# Patient Record
Sex: Female | Born: 1960 | Race: Black or African American | Hispanic: No | Smoking: Never smoker
Health system: Southern US, Community
[De-identification: ages and names within clinical notes are randomized; demographics above are authoritative.]

## PROBLEM LIST (undated history)

## (undated) DIAGNOSIS — D649 Anemia, unspecified: Secondary | ICD-10-CM

## (undated) DIAGNOSIS — K219 Gastro-esophageal reflux disease without esophagitis: Secondary | ICD-10-CM

## (undated) DIAGNOSIS — E119 Type 2 diabetes mellitus without complications: Secondary | ICD-10-CM

## (undated) DIAGNOSIS — I10 Essential (primary) hypertension: Secondary | ICD-10-CM

## (undated) DIAGNOSIS — M1990 Unspecified osteoarthritis, unspecified site: Secondary | ICD-10-CM

## (undated) HISTORY — PX: ABDOMINAL HYSTERECTOMY: SHX81

---

## 2013-05-16 DIAGNOSIS — E119 Type 2 diabetes mellitus without complications: Secondary | ICD-10-CM | POA: Insufficient documentation

## 2013-05-16 DIAGNOSIS — E782 Mixed hyperlipidemia: Secondary | ICD-10-CM | POA: Insufficient documentation

## 2013-05-16 DIAGNOSIS — K219 Gastro-esophageal reflux disease without esophagitis: Secondary | ICD-10-CM | POA: Insufficient documentation

## 2013-05-16 DIAGNOSIS — I509 Heart failure, unspecified: Secondary | ICD-10-CM | POA: Insufficient documentation

## 2013-08-03 DIAGNOSIS — G479 Sleep disorder, unspecified: Secondary | ICD-10-CM | POA: Insufficient documentation

## 2013-12-30 DIAGNOSIS — M722 Plantar fascial fibromatosis: Secondary | ICD-10-CM | POA: Insufficient documentation

## 2015-03-11 ENCOUNTER — Emergency Department
Admission: EM | Admit: 2015-03-11 | Discharge: 2015-03-11 | Discharge status: Against Medical Advice | Payer: BC Managed Care – PPO | Attending: Emergency Medicine | Admitting: Emergency Medicine

## 2015-03-11 ENCOUNTER — Encounter: Payer: Self-pay | Admitting: *Deleted

## 2015-03-11 ENCOUNTER — Other Ambulatory Visit: Payer: Self-pay

## 2015-03-11 DIAGNOSIS — E1165 Type 2 diabetes mellitus with hyperglycemia: Secondary | ICD-10-CM | POA: Insufficient documentation

## 2015-03-11 DIAGNOSIS — I10 Essential (primary) hypertension: Secondary | ICD-10-CM | POA: Insufficient documentation

## 2015-03-11 HISTORY — DX: Type 2 diabetes mellitus without complications: E11.9

## 2015-03-11 HISTORY — DX: Gastro-esophageal reflux disease without esophagitis: K21.9

## 2015-03-11 HISTORY — DX: Essential (primary) hypertension: I10

## 2015-03-11 HISTORY — DX: Anemia, unspecified: D64.9

## 2015-03-11 HISTORY — DX: Unspecified osteoarthritis, unspecified site: M19.90

## 2015-03-11 NOTE — ED Notes (Signed)
Pt states she was at home and called by her health insurance company for a survey. After answering the questions pt was told to come to the ed for eval. Pt is not complaining of any issues at this time. Pt has not been seen by her pcp. Recently. Pt states she has been out of her meds for a week or so.

## 2015-03-12 MED ORDER — DEXTROSE 5 % IV SOLN
INTRAVENOUS | Status: AC
  Filled 2015-03-12: qty 10

## 2016-08-03 DIAGNOSIS — J301 Allergic rhinitis due to pollen: Secondary | ICD-10-CM | POA: Insufficient documentation

## 2016-08-20 ENCOUNTER — Ambulatory Visit: Payer: BC Managed Care – PPO | Admitting: Podiatry

## 2016-09-07 ENCOUNTER — Ambulatory Visit: Payer: BC Managed Care – PPO | Admitting: Podiatry

## 2016-09-27 ENCOUNTER — Other Ambulatory Visit: Payer: Self-pay | Admitting: *Deleted

## 2016-09-27 ENCOUNTER — Encounter: Payer: Self-pay | Admitting: Podiatry

## 2016-09-27 ENCOUNTER — Ambulatory Visit (INDEPENDENT_AMBULATORY_CARE_PROVIDER_SITE_OTHER): Payer: BC Managed Care – PPO | Admitting: Podiatry

## 2016-09-27 ENCOUNTER — Ambulatory Visit (INDEPENDENT_AMBULATORY_CARE_PROVIDER_SITE_OTHER): Payer: BC Managed Care – PPO

## 2016-09-27 VITALS — BP 161/86 | HR 68 | Resp 16

## 2016-09-27 DIAGNOSIS — M722 Plantar fascial fibromatosis: Secondary | ICD-10-CM

## 2016-09-27 DIAGNOSIS — E1142 Type 2 diabetes mellitus with diabetic polyneuropathy: Secondary | ICD-10-CM

## 2016-09-27 DIAGNOSIS — E119 Type 2 diabetes mellitus without complications: Secondary | ICD-10-CM

## 2016-09-27 MED ORDER — MELOXICAM 15 MG PO TABS: 15.0000 mg | ORAL_TABLET | Freq: Every day | ORAL | 3 refills | Status: AC

## 2016-09-27 NOTE — Progress Notes (Signed)
   Subjective:    Patient ID: Audrey Reynolds, female    DOB: 03-02-1961, 55 y.o.   MRN: [id number]  HPI: She presents today chief complaint of plantar ill pain to the right. She states that is an aching for the past several months morning seems to be worse noted she is also developed some tingling to her toes which is sporadic and not all the time. She has tried ibuprofen for the heel with some degree of resolution. She recalls that her last hemoglobin A1c was around a 10.0. She states that she needs to have abdominal surgery for fibroids but being that it is an elective surgery she cannot have it.    Review of Systems  All other systems reviewed and are negative.      Objective:   Physical Exam: Vital signs are stable alert and oriented 3. Pulses are palpable. Neurologic sensorium is intact. Deep tendon reflexes are intact. Muscle strength is 5 over 5 wrist flexor plantar flexors and inverters inverters all intrinsic musculature is intact. Orthopedic evaluationpain on palpation medial cocaine tubercle of the right heel radiographs demonstrate flat feet with no major osseous abnormalities. No fractures are identified. 3 views bilateral foot were taken today in the office. She does have a soft tissue increase in density of the plantar fascia calcaneal insertion site of the right heel. Plantar distally oriented calcaneal heel spurs also noted.     Assessment & Plan:  Diabetes mellitus type 2 probable early diabetic neuropathy. Plantar fascitis right heel. Pes planus.  Plan: I injected the right heel today with Kenalog and local anesthetic. Placed her on meloxicam dispense plantar fascia brace and a night splint. Discussed appropriate shoe gear stretching exercises and ice therapy. Follow up with her in 1 month. Dispensed oral and written exercises physical therapy program.

## 2016-09-27 NOTE — Patient Instructions (Signed)

## 2016-10-27 ENCOUNTER — Encounter: Payer: Self-pay | Admitting: Podiatry

## 2016-10-27 ENCOUNTER — Ambulatory Visit (INDEPENDENT_AMBULATORY_CARE_PROVIDER_SITE_OTHER): Payer: BC Managed Care – PPO | Admitting: Podiatry

## 2016-10-27 DIAGNOSIS — M722 Plantar fascial fibromatosis: Secondary | ICD-10-CM

## 2016-10-28 NOTE — Progress Notes (Signed)
She presents today for follow-up of her plantar fasciitis in her right foot. She states that is approximately 100% improved states that it does not hurt hardly ever at this point and she continues her conservative therapies medications: Fish braces at night splint. She also purchased new shoes.  Objective: Vital signs are stable alert and oriented 3. Pulses are palpable. She has no pain on palpation medial calcaneal tubercle of the right heel.  Assessment: Resolved plantar fasciitis right.  Plan: Follow up with us on an as-needed basis.

## 2017-01-18 DIAGNOSIS — E669 Obesity, unspecified: Secondary | ICD-10-CM | POA: Insufficient documentation

## 2017-05-05 DIAGNOSIS — H8113 Benign paroxysmal vertigo, bilateral: Secondary | ICD-10-CM | POA: Insufficient documentation

## 2017-11-22 DIAGNOSIS — Z791 Long term (current) use of non-steroidal anti-inflammatories (NSAID): Secondary | ICD-10-CM | POA: Insufficient documentation

## 2017-11-22 DIAGNOSIS — G4700 Insomnia, unspecified: Secondary | ICD-10-CM | POA: Insufficient documentation

## 2017-11-22 DIAGNOSIS — E559 Vitamin D deficiency, unspecified: Secondary | ICD-10-CM | POA: Insufficient documentation

## 2017-11-22 DIAGNOSIS — R768 Other specified abnormal immunological findings in serum: Secondary | ICD-10-CM | POA: Insufficient documentation

## 2017-11-22 DIAGNOSIS — G8929 Other chronic pain: Secondary | ICD-10-CM | POA: Insufficient documentation

## 2017-11-22 DIAGNOSIS — R5383 Other fatigue: Secondary | ICD-10-CM | POA: Insufficient documentation

## 2017-11-22 DIAGNOSIS — R7689 Other specified abnormal immunological findings in serum: Secondary | ICD-10-CM | POA: Insufficient documentation

## 2017-11-22 DIAGNOSIS — G5601 Carpal tunnel syndrome, right upper limb: Secondary | ICD-10-CM | POA: Insufficient documentation

## 2017-11-22 DIAGNOSIS — M159 Polyosteoarthritis, unspecified: Secondary | ICD-10-CM | POA: Insufficient documentation

## 2017-11-22 DIAGNOSIS — L659 Nonscarring hair loss, unspecified: Secondary | ICD-10-CM | POA: Insufficient documentation

## 2018-01-12 DIAGNOSIS — IMO0001 Reserved for inherently not codable concepts without codable children: Secondary | ICD-10-CM | POA: Insufficient documentation

## 2018-04-13 DIAGNOSIS — Z79899 Other long term (current) drug therapy: Secondary | ICD-10-CM | POA: Insufficient documentation

## 2018-04-21 ENCOUNTER — Emergency Department: Payer: BC Managed Care – PPO

## 2018-04-21 ENCOUNTER — Emergency Department
Admission: EM | Admit: 2018-04-21 | Discharge: 2018-04-21 | Disposition: A | Discharge status: Home / Self Care | Payer: BC Managed Care – PPO | Attending: Emergency Medicine | Admitting: Emergency Medicine

## 2018-04-21 ENCOUNTER — Encounter: Payer: Self-pay | Admitting: Emergency Medicine

## 2018-04-21 ENCOUNTER — Other Ambulatory Visit: Payer: Self-pay

## 2018-04-21 DIAGNOSIS — Z7984 Long term (current) use of oral hypoglycemic drugs: Secondary | ICD-10-CM | POA: Insufficient documentation

## 2018-04-21 DIAGNOSIS — E119 Type 2 diabetes mellitus without complications: Secondary | ICD-10-CM | POA: Insufficient documentation

## 2018-04-21 DIAGNOSIS — I10 Essential (primary) hypertension: Secondary | ICD-10-CM | POA: Insufficient documentation

## 2018-04-21 DIAGNOSIS — R079 Chest pain, unspecified: Secondary | ICD-10-CM | POA: Diagnosis not present

## 2018-04-21 DIAGNOSIS — M549 Dorsalgia, unspecified: Secondary | ICD-10-CM | POA: Diagnosis not present

## 2018-04-21 LAB — BASIC METABOLIC PANEL
Anion gap: 11 (ref 5–15)
BUN: 9 mg/dL (ref 6–20)
CALCIUM: 9.3 mg/dL (ref 8.9–10.3)
CO2: 29 mmol/L (ref 22–32)
CREATININE: 0.61 mg/dL (ref 0.44–1.00)
Chloride: 93 mmol/L — ABNORMAL LOW (ref 101–111)
GFR calc Af Amer: >60 mL/min (ref >60)
Glucose, Bld: 307 mg/dL — ABNORMAL HIGH (ref 65–99)
POTASSIUM: 3.8 mmol/L (ref 3.5–5.1)
SODIUM: 133 mmol/L — AB (ref 135–145)

## 2018-04-21 LAB — CBC
HEMATOCRIT: 30.2 % — AB (ref 35.0–47.0)
Hemoglobin: 10.5 g/dL — ABNORMAL LOW (ref 12.0–16.0)
MCH: 29.4 pg (ref 26.0–34.0)
MCHC: 34.6 g/dL (ref 32.0–36.0)
MCV: 85.1 fL (ref 80.0–100.0)
PLATELETS: 283 10*3/uL (ref 150–440)
RBC: 3.55 MIL/uL — ABNORMAL LOW (ref 3.80–5.20)
RDW: 13.6 % (ref 11.5–14.5)
WBC: 9.1 10*3/uL (ref 3.6–11.0)

## 2018-04-21 LAB — TROPONIN I: Troponin I: <0.03 ng/mL (ref <0.03)

## 2018-04-21 MED ORDER — SODIUM CHLORIDE 0.9 % IV BOLUS
1000.0000 mL | Freq: Once | INTRAVENOUS | Status: AC
  Administered 2018-04-21: 1000 mL via INTRAVENOUS

## 2018-04-21 MED ORDER — IOPAMIDOL (ISOVUE-370) INJECTION 76%
75.0000 mL | Freq: Once | INTRAVENOUS | Status: AC | PRN
  Administered 2018-04-21: 75 mL via INTRAVENOUS

## 2018-04-21 NOTE — ED Notes (Signed)
Reviewed discharge instructions, follow-up care with patient. Patient verbalized understanding of all information reviewed. Patient stable, with no distress noted at this time.    

## 2018-04-21 NOTE — ED Provider Notes (Signed)
[facility] Emergency Department Provider Note ____________________________________________   First MD Initiated Contact with Patient 04/21/18 1706     (approximate)  I have reviewed the triage vital signs and the nursing notes.   HISTORY  Chief Complaint Chest Pain and Back Pain  HPI Audrey Reynolds is a 57 y.o. female with history of diabetes, hypertension and a lap scopic hysterectomy 4 days ago who is presenting to the emergency department 24 hours of chest pressure as well as right-sided back pressure which is constant.  Not associated with any cough or shortness of breath.  Slight worsening with deep breathing and movement.  Sent in by her primary care doctor for evaluation for pulmonary embolus.  Family history of CAD but no personal history of CAD.  Personal history of anemia.  Denies any vaginal bleeding or discharge.  Still with lower abdominal pain from the surgery which is now mild.  Says that the pain in her chest as well as the back is relieved by Tylenol.  Says that she has been nauseous as well since the surgery.  Past Medical History:  Diagnosis Date  . Diabetes mellitus without complication (HCC)   . Hypertension     There are no active problems to display for this patient.   Past Surgical History:  Procedure Laterality Date  . ABDOMINAL HYSTERECTOMY      Prior to Admission medications   Medication Sig Start Date End Date Taking? Authorizing Provider  acetaminophen-codeine (TYLENOL #3) 300-30 MG tablet  06/25/16   [provider]  amLODipine (NORVASC) 10 MG tablet  08/24/16   [provider]  amoxicillin-clavulanate (AUGMENTIN) 875-125 MG tablet  07/13/16   [provider]  atorvastatin (LIPITOR) 10 MG tablet  08/24/16   [provider]  cephALEXin (KEFLEX) 500 MG capsule  06/22/16   [provider]  doxycycline (VIBRAMYCIN) 100 MG capsule  06/22/16   [provider]  glipiZIDE  (GLUCOTROL) 10 MG tablet  08/03/16   [provider]  ibuprofen (ADVIL,MOTRIN) 600 MG tablet  08/03/16   [provider]  losartan-hydrochlorothiazide (HYZAAR) 100-25 MG tablet  08/03/16   [provider]  meloxicam (MOBIC) 15 MG tablet Take 1 tablet (15 mg total) by mouth daily. 09/27/16   Hyatt, Max T, DPM  mometasone (NASONEX) 50 MCG/ACT nasal spray  08/03/16   [provider]  ondansetron (ZOFRAN) 4 MG tablet  07/13/16   [provider]  pantoprazole (PROTONIX) 40 MG tablet  07/12/16   [provider]  polyethylene glycol powder (GLYCOLAX/MIRALAX) powder  06/25/16   [provider]  SSD 1 % cream  06/25/16   [provider]    Allergies Patient has no known allergies.  No family history on file.  Social History Social History   Tobacco Use  . Smoking status: Never Smoker  . Smokeless tobacco: Never Used  Substance Use Topics  . Alcohol use: No  . Drug use: Not on file    Review of Systems  Constitutional: No fever/chills Eyes: No visual changes. ENT: No sore throat. Cardiovascular: As above Respiratory: Denies shortness of breath. Gastrointestinal: No abdominal pain.  No nausea, no vomiting.  No diarrhea.  No constipation. Genitourinary: Negative for dysuria. Musculoskeletal: As above Skin: Negative for rash. Neurological: Negative for headaches, focal weakness or numbness.   ____________________________________________   PHYSICAL EXAM:  VITAL SIGNS: ED Triage Vitals  Enc Vitals Group     BP 04/21/18 1532 (!) 141/66  Pulse Rate 04/21/18 1532 92     Resp 04/21/18 1532 16     Temp 04/21/18 1532 98.9 F (37.2 C)     Temp Source 04/21/18 1532 Oral     SpO2 04/21/18 1532 99 %     Weight 04/21/18 1530 196 lb (88.9 kg)     Height 04/21/18 1530 5\' 5"  (1.651 m)     Head Circumference --      Peak Flow --      Pain Score 04/21/18 1530 8     Pain Loc --      Pain Edu? --      Excl. in GC? --      Constitutional: Alert and oriented. Well appearing and in no acute distress. Eyes: Conjunctivae are normal.  Head: Atraumatic. Nose: No congestion/rhinnorhea. Mouth/Throat: Mucous membranes are moist.  Neck: No stridor.   Cardiovascular: Normal rate, regular rhythm. Grossly normal heart sounds.  Chest pain is mildly reproducible underneath the right breast without any crepitus or deformity. Respiratory: Normal respiratory effort.  No retractions. Lungs CTAB. Gastrointestinal: Soft with mild tenderness to palpation across lower abdomen.  No rebound or guarding.  Surgical incisions are CDI. No distention. No CVA tenderness. Musculoskeletal: No lower extremity tenderness nor edema.  No joint effusions.  No tenderness to the thoracic nor lumbar spine regions. Neurologic:  Normal speech and language. No gross focal neurologic deficits are appreciated. Skin:  Skin is warm, dry and intact. No rash noted. Psychiatric: Mood and affect are normal. Speech and behavior are normal.  ____________________________________________   LABS (all labs ordered are listed, but only abnormal results are displayed)  Labs Reviewed  BASIC METABOLIC PANEL - Abnormal; Notable for the following components:      Result Value   Sodium 133 (*)    Chloride 93 (*)    Glucose, Bld 307 (*)    All other components within normal limits  CBC - Abnormal; Notable for the following components:   RBC 3.55 (*)    Hemoglobin 10.5 (*)    HCT 30.2 (*)    All other components within normal limits  TROPONIN I  TROPONIN I  POC URINE PREG, ED   ____________________________________________  EKG  ED ECG REPORT I, Arelia Longest, the attending physician, personally viewed and interpreted this ECG.   Date: 04/21/2018  EKG Time: 1537  Rate: 93  Rhythm: normal sinus rhythm  Axis: Normal  Intervals:none  ST&T Change: No ST segment elevation or depression.  T wave inversions in 1 as well as aVL.  No previous EKG for  comparison  ____________________________________________  RADIOLOGY  Chest x-ray without any acute abnormality  CT angiography was negative for PE as well as other acute process ____________________________________________   PROCEDURES  Procedure(s) performed:   Procedures  Critical Care performed:   ____________________________________________   INITIAL IMPRESSION / ASSESSMENT AND PLAN / ED COURSE  Pertinent labs & imaging results that were available during my care of the patient were reviewed by me and considered in my medical decision making (see chart for details).  Differential diagnosis includes, but is not limited to, ACS, aortic dissection, pulmonary embolism, cardiac tamponade, pneumothorax, pneumonia, pericarditis, myocarditis, GI-related causes including esophagitis/gastritis, and musculoskeletal chest wall pain.   No previous records on file for review  ----------------------------------------- 7:58 PM on 04/21/2018 -----------------------------------------  Patient remains without distress.  Not tachycardic.  Second troponin negative.  Reassuring CTA.  Possible chest wall pain.  Patient will follow-up with cardiology.  She knows that  she was follow-up in the next 48 to 72 hours.  Patient understanding the diagnosis as well as treatment plan willing to comply.  ____________________________________________   FINAL CLINICAL IMPRESSION(S) / ED DIAGNOSES  Chest pain    NEW MEDICATIONS STARTED DURING THIS VISIT:  New Prescriptions   No medications on file     Note:  This document was prepared using Dragon voice recognition software and may include unintentional dictation errors.     Myrna Blazer, MD 04/21/18 [phone]

## 2018-04-21 NOTE — ED Triage Notes (Signed)
Pt to ED via POV c/o right sided chest pain and and back pain. Pt states that she was advised by her MD to come to ED and be evaluated for PE. Pt had surgery on Friday. Pt states that she has been having shortness of breath as well. Pt is in NAD at this time, able to speak in complete sentences.

## 2018-04-24 ENCOUNTER — Encounter: Payer: Self-pay | Admitting: *Deleted

## 2018-04-24 ENCOUNTER — Telehealth: Payer: Self-pay

## 2018-04-24 NOTE — Telephone Encounter (Signed)
Lmov for patient to call and schedule ED fu She was seen on 04/21/18 for CP   Will try again at a later time

## 2018-04-27 NOTE — Telephone Encounter (Signed)
Pt scheduled 06/21/18 with Dr Okey DupreEnd

## 2018-06-21 ENCOUNTER — Encounter: Payer: Self-pay | Admitting: Internal Medicine

## 2018-06-21 ENCOUNTER — Ambulatory Visit (INDEPENDENT_AMBULATORY_CARE_PROVIDER_SITE_OTHER): Payer: BC Managed Care – PPO | Admitting: Internal Medicine

## 2018-06-21 VITALS — BP 164/84 | HR 83 | Ht 64.0 in | Wt 200.0 lb

## 2018-06-21 DIAGNOSIS — I10 Essential (primary) hypertension: Secondary | ICD-10-CM

## 2018-06-21 DIAGNOSIS — I272 Pulmonary hypertension, unspecified: Secondary | ICD-10-CM | POA: Diagnosis not present

## 2018-06-21 DIAGNOSIS — R0789 Other chest pain: Secondary | ICD-10-CM | POA: Diagnosis not present

## 2018-06-21 NOTE — Patient Instructions (Signed)
Medication Instructions:  Your physician recommends that you continue on your current medications as directed. Please refer to the Current Medication list given to you today.   Labwork: none  Testing/Procedures: none  Follow-Up: Your physician recommends that you schedule a follow-up appointment in: 2 MONTHS WITH DR END OR APP.   Any Other Special Instructions Will Be Listed Below (If Applicable).  Please monitor your blood pressure and heart rate at home. Call us if it is consistently greater than 140/90. Your physician has requested that you regularly monitor and record your blood pressure readings at home. Please use the same machine at the same time of day to check your readings and record them to bring to your follow-up visit.     If you need a refill on your cardiac medications before your next appointment, please call your pharmacy.      DASH Eating Plan DASH stands for "Dietary Approaches to Stop Hypertension." The DASH eating plan is a healthy eating plan that has been shown to reduce high blood pressure (hypertension). It may also reduce your risk for type 2 diabetes, heart disease, and stroke. The DASH eating plan may also help with weight loss. What are tips for following this plan? General guidelines  Avoid eating more than 2,300 mg (milligrams) of salt (sodium) a day. If you have hypertension, you may need to reduce your sodium intake to 1,500 mg a day.  Limit alcohol intake to no more than 1 drink a day for nonpregnant women and 2 drinks a day for men. One drink equals 12 oz of beer, 5 oz of wine, or 1 oz of hard liquor.  Work with your health care provider to maintain a healthy body weight or to lose weight. Ask what an ideal weight is for you.  Get at least 30 minutes of exercise that causes your heart to beat faster (aerobic exercise) most days of the week. Activities may include walking, swimming, or biking.  Work with your health care provider or diet and  nutrition specialist (dietitian) to adjust your eating plan to your individual calorie needs. Reading food labels  Check food labels for the amount of sodium per serving. Choose foods with less than 5 percent of the Daily Value of sodium. Generally, foods with less than 300 mg of sodium per serving fit into this eating plan.  To find whole grains, look for the word "whole" as the first word in the ingredient list. Shopping  Buy products labeled as "low-sodium" or "no salt added."  Buy fresh foods. Avoid canned foods and premade or frozen meals. Cooking  Avoid adding salt when cooking. Use salt-free seasonings or herbs instead of table salt or sea salt. Check with your health care provider or pharmacist before using salt substitutes.  Do not fry foods. Cook foods using healthy methods such as baking, boiling, grilling, and broiling instead.  Cook with heart-healthy oils, such as olive, canola, soybean, or sunflower oil. Meal planning   Eat a balanced diet that includes: ? 5 or more servings of fruits and vegetables each day. At each meal, try to fill half of your plate with fruits and vegetables. ? Up to 6-8 servings of whole grains each day. ? Less than 6 oz of lean meat, poultry, or fish each day. A 3-oz serving of meat is about the same size as a deck of cards. One egg equals 1 oz. ? 2 servings of low-fat dairy each day. ? A serving of nuts, seeds, or beans  5 times each week. ? Heart-healthy fats. Healthy fats called Omega-3 fatty acids are found in foods such as flaxseeds and coldwater fish, like sardines, salmon, and mackerel.  Limit how much you eat of the following: ? Canned or prepackaged foods. ? Food that is high in trans fat, such as fried foods. ? Food that is high in saturated fat, such as fatty meat. ? Sweets, desserts, sugary drinks, and other foods with added sugar. ? Full-fat dairy products.  Do not salt foods before eating.  Try to eat at least 2 vegetarian  meals each week.  Eat more home-cooked food and less restaurant, buffet, and fast food.  When eating at a restaurant, ask that your food be prepared with less salt or no salt, if possible. What foods are recommended? The items listed may not be a complete list. Talk with your dietitian about what dietary choices are best for you. Grains Whole-grain or whole-wheat bread. Whole-grain or whole-wheat pasta. Brown rice. Orpah Cobbatmeal. Quinoa. Bulgur. Whole-grain and low-sodium cereals. Pita bread. Low-fat, low-sodium crackers. Whole-wheat flour tortillas. Vegetables Fresh or frozen vegetables (raw, steamed, roasted, or grilled). Low-sodium or reduced-sodium tomato and vegetable juice. Low-sodium or reduced-sodium tomato sauce and tomato paste. Low-sodium or reduced-sodium canned vegetables. Fruits All fresh, dried, or frozen fruit. Canned fruit in natural juice (without added sugar). Meat and other protein foods Skinless chicken or Malawiturkey. Ground chicken or Mal[place]urkey. Pork with fat trimmed off. Fish and seafood. Egg whites. Dried beans, peas, or lentils. Unsalted nuts, nut butters, and seeds. Unsalted canned beans. Lean cuts of beef with fat trimmed off. Low-sodium, lean deli meat. Dairy Low-fat (1%) or fat-free (skim) milk. Fat-free, low-fat, or reduced-fat cheeses. Nonfat, low-sodium ricotta or cottage cheese. Low-fat or nonfat yogurt. Low-fat, low-sodium cheese. Fats and oils Soft margarine without trans fats. Vegetable oil. Low-fat, reduced-fat, or light mayonnaise and salad dressings (reduced-sodium). Canola, safflower, olive, soybean, and sunflower oils. Avocado. Seasoning and other foods Herbs. Spices. Seasoning mixes without salt. Unsalted popcorn and pretzels. Fat-free sweets. What foods are not recommended? The items listed may not be a complete list. Talk with your dietitian about what dietary choices are best for you. Grains Baked goods made with fat, such as croissants, muffins, or some  breads. Dry pasta or rice meal packs. Vegetables Creamed or fried vegetables. Vegetables in a cheese sauce. Regular canned vegetables (not low-sodium or reduced-sodium). Regular canned tomato sauce and paste (not low-sodium or reduced-sodium). Regular tomato and vegetable juice (not low-sodium or reduced-sodium). Rosita FirePickles. Olives. Fruits Canned fruit in a light or heavy syrup. Fried fruit. Fruit in cream or butter sauce. Meat and other protein foods Fatty cuts of meat. Ribs. Fried meat. Tomasa BlaseBacon. Sausage. Bologna and other processed lunch meats. Salami. Fatback. Hotdogs. Bratwurst. Salted nuts and seeds. Canned beans with added salt. Canned or smoked fish. Whole eggs or egg yolks. Chicken or Malaw[place]urkey with skin. Dairy Whole or 2% milk, cream, and half-and-half. Whole or full-fat cream cheese. Whole-fat or sweetened yogurt. Full-fat cheese. Nondairy creamers. Whipped toppings. Processed cheese and cheese spreads. Fats and oils Butter. Stick margarine. Lard. Shortening. Ghee. Bacon fat. Tropical oils, such as coconut, palm kernel, or palm oil. Seasoning and other foods Salted popcorn and pretzels. Onion salt, garlic salt, seasoned salt, table salt, and sea salt. Worcestershire sauce. Tartar sauce. Barbecue sauce. Teriyaki sauce. Soy sauce, including reduced-sodium. Steak sauce. Canned and packaged gravies. Fish sauce. Oyster sauce. Cocktail sauce. Horseradish that you find on the shelf. Ketchup. Mustard. Meat flavorings and tenderizers. Bouillon cubes.  Hot sauce and Tabasco sauce. Premade or packaged marinades. Premade or packaged taco seasonings. Relishes. Regular salad dressings. Where to find more information:  National Heart, Lung, and Blood Institute: PopSteam.iswww.nhlbi.nih.gov  American Heart Association: www.heart.org Summary  The DASH eating plan is a healthy eating plan that has been shown to reduce high blood pressure (hypertension). It may also reduce your risk for type 2 diabetes, heart disease, and  stroke.  With the DASH eating plan, you should limit salt (sodium) intake to 2,300 mg a day. If you have hypertension, you may need to reduce your sodium intake to 1,500 mg a day.  When on the DASH eating plan, aim to eat more fresh fruits and vegetables, whole grains, lean proteins, low-fat dairy, and heart-healthy fats.  Work with your health care provider or diet and nutrition specialist (dietitian) to adjust your eating plan to your individual calorie needs. This information is not intended to replace advice given to you by your health care provider. Make sure you discuss any questions you have with your health care provider. Document Released: 10/14/2011 Document Revised: 10/18/2016 Document Reviewed: 10/18/2016 Elsevier Interactive Patient Education  [employer]2018 [employer]

## 2018-06-21 NOTE — Progress Notes (Signed)
New Outpatient Visit Date: 06/21/2018  Referring Provider: Physicians, [facility] [address] [place], [place] [id number]  Chief Complaint: Chest pain  HPI:  [name] is a 57 y.o. female who is being seen today for the evaluation of chest pain following emergency department visit in June. She has a history of type 2 diabetes mellitus, hypertension, anemia, and obesity.  [facility] department on 04/21/2018 with chest pressure accompanied by right-sided back pressure.  Pain was slightly worsened by deep inspiration and movement.  She had been evaluated earlier that day by her PCP, her who referred her to the emergency department for evaluation of pulmonary embolism.  CTA of the chest showed no evidence of PE.  Troponins were negative x2.  The of note, the patient had undergone partial hysterectomy 1 week prior.  She wonders if the pain was related to gas.  She has not had any recurrence.  Patient notes that she had chest pain many years ago and underwent myocardial perfusion stress [facility] that was low risk.  She was seen for preoperative evaluation by [facility] cardiology prior to her hysterectomy and was felt to be okay to proceed.  She has a hard time describing the pain that she experienced back in June.  She has occasionally had transient sharp pains around the scar from prior chest wall surgery, though believes that what she felt in June was different.  She is able to ambulate without difficulty, typically walking for 45 to 60 minutes at a time.  She has not had any shortness of breath, palpitations, lightheadedness, or edema.  --------------------------------------------------------------------------------------------------  Cardiovascular History & Procedures: Cardiovascular Problems:  Chest pain  Risk Factors:  Diabetes mellitus, hypertension, and obesity  Cath/PCI:  None  CV Surgery:  None  EP Procedures and Devices:  None  Non-Invasive  Evaluation(s):  Exercise MPI (07/31/2013, [facility]): Normal study without ischemia or scar, though sensitivity and specificity diminished by breast and diaphragmatic attenuation.  LVEF greater than 65%.  Echocardiogram (05/17/2013): Normal LV size with mild LVH.  LVEF 60 to 65% with normal diastolic function.  Mild left atrial enlargement.  Moderate pulmonary hypertension.  Normal RV size and function.  Mild to moderate TR.  Recent CV Pertinent Labs: Lab Results  Component Value Date   K 3.8 04/21/2018   BUN 9 04/21/2018   CREATININE 0.61 04/21/2018    --------------------------------------------------------------------------------------------------  Past Medical History:  Diagnosis Date  . Anemia   . Arthritis   . Diabetes mellitus without complication (HCC)   . GERD (gastroesophageal reflux disease)   . Hypertension     Past Surgical History:  Procedure Laterality Date  . ABDOMINAL HYSTERECTOMY      Current Meds  Medication Sig  . acetaminophen-codeine (TYLENOL #3) 300-30 MG tablet   . amLODipine (NORVASC) 10 MG tablet   . atorvastatin (LIPITOR) 10 MG tablet   . Dulaglutide (TRULICITY) 0.75 MG/0.5ML SOPN Inject 0.75 mg into the skin once a week.  Marland Kitchen. glipiZIDE (GLUCOTROL) 10 MG tablet   . ibuprofen (ADVIL,MOTRIN) 600 MG tablet   . losartan-hydrochlorothiazide (HYZAAR) 100-25 MG tablet   . meloxicam (MOBIC) 15 MG tablet Take 1 tablet (15 mg total) by mouth daily.  . mometasone (NASONEX) 50 MCG/ACT nasal spray   . ondansetron (ZOFRAN) 4 MG tablet   . pantoprazole (PROTONIX) 40 MG tablet   . polyethylene glycol powder (GLYCOLAX/MIRALAX) powder   . SSD 1 % cream     Allergies: Vancomycin and Ace inhibitors  Social History   Tobacco Use  . Smoking status: Never Smoker  . Smokeless tobacco: Never Used  Substance Use Topics  . Alcohol use: No  . Drug use: Never    Family History  Problem Relation Age of Onset  . Heart disease Mother 1245  . Hypertension Mother   .  Diabetes Mother     Review of Systems: Patient notes occasional vertigo when lying back at night and closing her eyes.  Otherwise, a 12-system review of systems was performed and was negative except as noted in the HPI.  --------------------------------------------------------------------------------------------------  Physical Exam: BP (!) 164/84 (BP Location: Right Leg, Patient Position: Sitting, Cuff Size: Normal)   Pulse 83   Ht 5\' 4"  (1.626 m)   Wt 200 lb (90.7 kg)   BMI 34.33 kg/m   General: NAD. HEENT: No conjunctival pallor or scleral icterus. Moist mucous membranes. OP clear. Neck: Supple without lymphadenopathy, thyromegaly, JVD, or HJR. No carotid bruit. Lungs: Normal work of breathing. Clear to auscultation bilaterally without wheezes or crackles. Heart: Regular rate and rhythm without murmurs, rubs, or gallops.  Unable to assess PMI due to body habitus. Abd: Bowel sounds present. Soft, NT/ND.  Unable to assess HSM due to body habitus. Ext: No lower extremity edema. Radial, PT, and DP pulses are 2+ bilaterally Skin: Warm and dry without rash.  Well-healed scar overlying the lower sternum without tenderness. Neuro: CNIII-XII intact. Strength and fine-touch sensation intact in upper and lower extremities bilaterally. Psych: Normal mood and affect.  EKG: Normal sinus rhythm with nonspecific T wave changes.  Lab Results  Component Value Date   WBC 9.1 04/21/2018   HGB 10.5 (L) 04/21/2018   HCT 30.2 (L) 04/21/2018   MCV 85.1 04/21/2018   PLT 283 04/21/2018    Lab Results  Component Value Date   NA 133 (L) 04/21/2018   K 3.8 04/21/2018   CL 93 (L) 04/21/2018   CO2 29 04/21/2018   BUN 9 04/21/2018   CREATININE 0.61 04/21/2018   GLUCOSE 307 (H) 04/21/2018    No results found for: CHOL, HDL, LDLCALC, LDLDIRECT, TRIG, CHOLHDL   --------------------------------------------------------------------------------------------------  ASSESSMENT AND PLAN: Atypical  chest pain Pain was pleuritic in nature and may have been related to referred pain from her recent hysterectomy versus musculoskeletal pain.  CTA at the time showed no evidence of pulmonary embolism.  She has not had a recurrence of pain.  I have a low suspicion for obstructive CAD.  Myocardial perfusion stress test in 2014 was low risk.  We have discussed monitoring for new symptoms versus repeating noninvasive ischemia testing.  The patient wishes to defer testing at this time.  I have asked her to contact us if she has recurrent chest pain.  Otherwise, we will have her follow-up in 2 months for reassessment.  Pulmonary hypertension Noted by echo in 2014.  She does not expressed any symptoms of significant pulmonary hypertension or right heart failure such as shortness of breath or leg swelling.  We will readdress repeating an echocardiogram when she returns for follow-up.  Hypertension Blood pressure suboptimally controlled today.  We discussed addition of amlodipine, as she is already on max dose of losartan-HCTZ, but the patient would like to try lifestyle modifications first.  I provided her information about the DASH diet.  If her blood pressure remains above 140/90 at follow-up, additional medications will need to be considered.  Follow-up: Return to clinic in 2 months.  Yvonne Kendallhristopher Melayah Skorupski, MD 06/23/2018 7:22 AM

## 2018-06-22 ENCOUNTER — Telehealth: Payer: Self-pay | Admitting: Internal Medicine

## 2018-06-22 NOTE — Telephone Encounter (Signed)
Scheduled 10/31 with R. Shea Evansunn

## 2018-06-22 NOTE — Telephone Encounter (Signed)
LMOV to schedule 2 month f/u with Dr. Okey DupreEnd or APP

## 2018-06-23 ENCOUNTER — Encounter: Payer: Self-pay | Admitting: Internal Medicine

## 2018-06-23 DIAGNOSIS — I272 Pulmonary hypertension, unspecified: Secondary | ICD-10-CM | POA: Insufficient documentation

## 2018-06-23 DIAGNOSIS — R0789 Other chest pain: Secondary | ICD-10-CM | POA: Insufficient documentation

## 2018-06-23 DIAGNOSIS — I10 Essential (primary) hypertension: Secondary | ICD-10-CM | POA: Insufficient documentation

## 2018-09-07 ENCOUNTER — Ambulatory Visit: Payer: BC Managed Care – PPO | Admitting: Physician Assistant

## 2018-09-18 ENCOUNTER — Ambulatory Visit: Payer: BC Managed Care – PPO | Admitting: Physician Assistant

## 2019-03-26 ENCOUNTER — Encounter: Payer: Self-pay | Admitting: Podiatry

## 2019-03-26 ENCOUNTER — Ambulatory Visit: Payer: BC Managed Care – PPO | Admitting: Podiatry

## 2019-03-26 ENCOUNTER — Other Ambulatory Visit: Payer: Self-pay

## 2019-03-26 VITALS — Temp 97.3°F

## 2019-03-26 DIAGNOSIS — B351 Tinea unguium: Secondary | ICD-10-CM

## 2019-03-26 DIAGNOSIS — M79674 Pain in right toe(s): Secondary | ICD-10-CM | POA: Diagnosis not present

## 2019-03-26 DIAGNOSIS — M79675 Pain in left toe(s): Secondary | ICD-10-CM | POA: Diagnosis not present

## 2019-03-26 DIAGNOSIS — E119 Type 2 diabetes mellitus without complications: Secondary | ICD-10-CM | POA: Diagnosis not present

## 2019-03-26 NOTE — Progress Notes (Addendum)
This patient presents to the office with chief complaint of long thick nails and diabetic feet.  This patient  says there  is   Pain in her big toenail right foot for over one week    This patient says there are long thick painful nails.  These nails are painful walking and wearing shoes.  Patient has no history of infection or drainage from both feet.  Patient is unable to  self treat his own nails . This patient presents  to the office today for treatment of the  long nails and a foot evaluation due to history of  diabetes.  General Appearance  Alert, conversant and in no acute stress.  Vascular  Dorsalis pedis and posterior tibial  pulses are palpable  bilaterally.  Capillary return is within normal limits  bilaterally. Temperature is within normal limits  bilaterally.  Neurologic  Senn-Weinstein monofilament wire test within normal limits  bilaterally. Muscle power within normal limits bilaterally.  Nails Thick disfigured discolored nails with subungual debris  from hallux to fifth toes bilaterally. No evidence of bacterial infection or drainage bilaterally. Right hallux nail unattached from nail bed in the absence of infection or drainage.  Orthopedic  No limitations of motion of motion feet .  No crepitus or effusions noted.  No bony pathology or digital deformities noted.  Skin  normotropic skin with no porokeratosis noted bilaterally.  No signs of infections or ulcers noted.     Onychomycosis  Diabetes with no foot complications  IE  Debride nails x 10.  A diabetic foot exam was performed and there is no evidence of any vascular or neurologic pathology.   RTC 3 months.   Helane Gunther DPM

## 2019-11-19 ENCOUNTER — Other Ambulatory Visit: Payer: Self-pay

## 2019-11-19 ENCOUNTER — Emergency Department
Admission: EM | Admit: 2019-11-19 | Discharge: 2019-11-19 | Disposition: A | Discharge status: Home / Self Care | Payer: BC Managed Care – PPO | Attending: Emergency Medicine | Admitting: Emergency Medicine

## 2019-11-19 ENCOUNTER — Emergency Department: Payer: BC Managed Care – PPO

## 2019-11-19 DIAGNOSIS — Y999 Unspecified external cause status: Secondary | ICD-10-CM | POA: Insufficient documentation

## 2019-11-19 DIAGNOSIS — M549 Dorsalgia, unspecified: Secondary | ICD-10-CM | POA: Insufficient documentation

## 2019-11-19 DIAGNOSIS — Y92410 Unspecified street and highway as the place of occurrence of the external cause: Secondary | ICD-10-CM | POA: Diagnosis not present

## 2019-11-19 DIAGNOSIS — R0789 Other chest pain: Secondary | ICD-10-CM | POA: Insufficient documentation

## 2019-11-19 DIAGNOSIS — Y9389 Activity, other specified: Secondary | ICD-10-CM | POA: Insufficient documentation

## 2019-11-19 DIAGNOSIS — Z79899 Other long term (current) drug therapy: Secondary | ICD-10-CM | POA: Insufficient documentation

## 2019-11-19 DIAGNOSIS — I10 Essential (primary) hypertension: Secondary | ICD-10-CM | POA: Diagnosis not present

## 2019-11-19 DIAGNOSIS — M542 Cervicalgia: Secondary | ICD-10-CM | POA: Diagnosis not present

## 2019-11-19 DIAGNOSIS — Z7984 Long term (current) use of oral hypoglycemic drugs: Secondary | ICD-10-CM | POA: Insufficient documentation

## 2019-11-19 DIAGNOSIS — E119 Type 2 diabetes mellitus without complications: Secondary | ICD-10-CM | POA: Insufficient documentation

## 2019-11-19 MED ORDER — ONDANSETRON 4 MG PO TBDP
4.0000 mg | ORAL_TABLET | Freq: Once | ORAL | Status: AC
  Administered 2019-11-19: 22:00:00 4 mg via ORAL
  Filled 2019-11-19: qty 1

## 2019-11-19 MED ORDER — HYDROCODONE-ACETAMINOPHEN 5-325 MG PO TABS
1.0000 | ORAL_TABLET | Freq: Once | ORAL | Status: AC
  Administered 2019-11-19: 1 via ORAL
  Filled 2019-11-19: qty 1

## 2019-11-19 MED ORDER — METHOCARBAMOL 500 MG PO TABS: 500.0000 mg | ORAL_TABLET | Freq: Three times a day (TID) | ORAL | 0 refills | Status: AC | PRN
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
## 2020-02-22 DIAGNOSIS — R197 Diarrhea, unspecified: Secondary | ICD-10-CM | POA: Diagnosis not present

## 2020-02-22 DIAGNOSIS — I110 Hypertensive heart disease with heart failure: Secondary | ICD-10-CM | POA: Insufficient documentation

## 2020-02-22 DIAGNOSIS — I509 Heart failure, unspecified: Secondary | ICD-10-CM | POA: Insufficient documentation

## 2020-02-22 DIAGNOSIS — E1165 Type 2 diabetes mellitus with hyperglycemia: Secondary | ICD-10-CM | POA: Diagnosis not present

## 2020-02-22 DIAGNOSIS — Z79899 Other long term (current) drug therapy: Secondary | ICD-10-CM | POA: Insufficient documentation

## 2020-02-22 DIAGNOSIS — R5383 Other fatigue: Secondary | ICD-10-CM | POA: Insufficient documentation

## 2020-02-22 LAB — URINALYSIS, COMPLETE (UACMP) WITH MICROSCOPIC
Bilirubin Urine: NEGATIVE
Glucose, UA: >=500 mg/dL — AB
Hgb urine dipstick: NEGATIVE
Ketones, ur: NEGATIVE mg/dL
Leukocytes,Ua: NEGATIVE
Nitrite: NEGATIVE
Protein, ur: NEGATIVE mg/dL
Specific Gravity, Urine: 1.033 — ABNORMAL HIGH (ref 1.005–1.030)
pH: 5 (ref 5.0–8.0)

## 2020-02-22 LAB — COMPREHENSIVE METABOLIC PANEL
ALT: 18 U/L (ref 0–44)
AST: 14 U/L — ABNORMAL LOW (ref 15–41)
Albumin: 4.1 g/dL (ref 3.5–5.0)
Alkaline Phosphatase: 76 U/L (ref 38–126)
Anion gap: 11 (ref 5–15)
BUN: 14 mg/dL (ref 6–20)
CO2: 27 mmol/L (ref 22–32)
Calcium: 9.6 mg/dL (ref 8.9–10.3)
Chloride: 100 mmol/L (ref 98–111)
Creatinine, Ser: 0.73 mg/dL (ref 0.44–1.00)
GFR calc Af Amer: >60 mL/min (ref >60)
GFR calc non Af Amer: >60 mL/min (ref >60)
Glucose, Bld: 308 mg/dL — ABNORMAL HIGH (ref 70–99)
Potassium: 3.6 mmol/L (ref 3.5–5.1)
Sodium: 138 mmol/L (ref 135–145)
Total Bilirubin: 0.9 mg/dL (ref 0.3–1.2)
Total Protein: 7.6 g/dL (ref 6.5–8.1)

## 2020-02-22 LAB — CBC
HCT: 45 % (ref 36.0–46.0)
Hemoglobin: 15.1 g/dL — ABNORMAL HIGH (ref 12.0–15.0)
MCH: 28.3 pg (ref 26.0–34.0)
MCHC: 33.6 g/dL (ref 30.0–36.0)
MCV: 84.4 fL (ref 80.0–100.0)
Platelets: 177 10*3/uL (ref 150–400)
RBC: 5.33 MIL/uL — ABNORMAL HIGH (ref 3.87–5.11)
RDW: 12.9 % (ref 11.5–15.5)
WBC: 6.7 10*3/uL (ref 4.0–10.5)
nRBC: 0 % (ref 0.0–0.2)

## 2020-02-22 LAB — GLUCOSE, CAPILLARY: Glucose-Capillary: 285 mg/dL — ABNORMAL HIGH (ref 70–99)

## 2020-02-22 LAB — LIPASE, BLOOD: Lipase: 47 U/L (ref 11–51)

## 2020-02-22 NOTE — ED Triage Notes (Signed)
Pt came from Urgent Care, pt reports has had diarrhea all day, feeling tired fatigued, pt has diabetes has not been checking CBG ran out of strips. Pt talks in complete sentences.

## 2020-02-23 ENCOUNTER — Emergency Department
Admission: EM | Admit: 2020-02-23 | Discharge: 2020-02-23 | Disposition: A | Discharge status: Home / Self Care | Payer: BC Managed Care – PPO | Attending: Emergency Medicine | Admitting: Emergency Medicine

## 2020-02-23 DIAGNOSIS — R739 Hyperglycemia, unspecified: Secondary | ICD-10-CM

## 2020-02-23 DIAGNOSIS — R197 Diarrhea, unspecified: Secondary | ICD-10-CM

## 2020-02-23 MED ORDER — FLUCONAZOLE 50 MG PO TABS
150.0000 mg | ORAL_TABLET | Freq: Once | ORAL | Status: AC
  Administered 2020-02-23: 150 mg via ORAL
  Filled 2020-02-23: qty 1

## 2020-02-23 MED ORDER — ALUM & MAG HYDROXIDE-SIMETH 200-200-20 MG/5ML PO SUSP
30.0000 mL | Freq: Once | ORAL | Status: AC
  Administered 2020-02-23: 30 mL via ORAL
  Filled 2020-02-23: qty 30

## 2020-02-23 MED ORDER — SODIUM CHLORIDE 0.9 % IV BOLUS
1000.0000 mL | Freq: Once | INTRAVENOUS | Status: AC
  Administered 2020-02-23: 1000 mL via INTRAVENOUS

## 2020-02-23 MED ORDER — LIDOCAINE VISCOUS HCL 2 % MT SOLN
15.0000 mL | Freq: Once | OROMUCOSAL | Status: AC
  Administered 2020-02-23: 15 mL via ORAL
  Filled 2020-02-23: qty 15

## 2020-02-23 NOTE — Discharge Instructions (Addendum)
Please take your medicines every day as directed by your doctor.  Return to the ER for worsening symptoms, persistent vomiting, difficulty breathing or other concerns.

## 2020-02-23 NOTE — ED Notes (Signed)
E signature pad not working. Pt educated on discharge instructions and verbalized understanding.  

## 2020-02-23 NOTE — ED Provider Notes (Signed)
[facility] Emergency Department Provider Note   ____________________________________________   First MD Initiated Contact with Patient 02/23/20 0112     (approximate)  I have reviewed the triage vital signs and the nursing notes.   HISTORY  Chief Complaint Diarrhea, fatigue   HPI Audrey Reynolds is a 59 y.o. female referred from urgent care for evaluation of diarrhea and fatigue.  Patient reports 4 episodes of loose stools today.  History of diabetes who has missed dosages of her medicines.  She did have a rapid Covid test at urgent care which was negative.  Denies fever, cough, chest pain, shortness of breath, abdominal pain, nausea or vomiting.      Past Medical History:  Diagnosis Date  . Anemia   . Arthritis   . Diabetes mellitus without complication ([place])   . GERD (gastroesophageal reflux disease)   . Hypertension     Patient Active Problem List   Diagnosis Date Noted  . Atypical chest pain 06/23/2018  . Pulmonary hypertension, unspecified ([place]) 06/23/2018  . Essential hypertension 06/23/2018  . Long-term use of Plaquenil 04/13/2018  . No diabetic retinopathy in both eyes 01/12/2018  . Carpal tunnel syndrome of right wrist 11/22/2017  . Chronic right-sided low back pain with right-sided sciatica 11/22/2017  . Fatigue 11/22/2017  . Hair loss 11/22/2017  . Insomnia 11/22/2017  . Long term current use of non-steroidal anti-inflammatories (NSAID) 11/22/2017  . Positive ANA (antinuclear antibody) 11/22/2017  . Primary osteoarthritis involving multiple joints 11/22/2017  . Vitamin D deficiency 11/22/2017  . Benign paroxysmal positional vertigo due to bilateral vestibular disorder 05/05/2017  . Obesity, Class II, BMI 35-39.9 01/18/2017  . Seasonal allergic rhinitis due to pollen 08/03/2016  . Plantar fasciitis 12/30/2013  . Sleep disturbances 08/03/2013  . CHF (congestive heart failure) ([place]) 05/16/2013  . Gastroesophageal reflux  disease without esophagitis 05/16/2013  . Mixed hyperlipidemia 05/16/2013  . Type 2 diabetes mellitus without complication, with long-term current use of insulin ([place]) 05/16/2013    Past Surgical History:  Procedure Laterality Date  . ABDOMINAL HYSTERECTOMY      Prior to Admission medications   Medication Sig Start Date End Date Taking? Authorizing Provider  amLODipine (NORVASC) 10 MG tablet  08/24/16   [provider]  atorvastatin (LIPITOR) 10 MG tablet  08/24/16   [provider]  clobetasol (TEMOVATE) 0.05 % external solution Apply topically 2 (two) times daily. 10/06/18   [provider]  Dulaglutide (TRULICITY) 1.15 BW/6.2MB SOPN Inject 0.75 mg into the skin once a week.    [provider]  Empagliflozin-metFORMIN HCl ER 03-999 MG TB24 Take by mouth. 02/13/19   [provider]  glimepiride (AMARYL) 4 MG tablet Take 4 mg by mouth 2 (two) times daily. 03/19/19   [provider]  glipiZIDE (GLUCOTROL) 10 MG tablet  08/03/16   [provider]  hydroxychloroquine (PLAQUENIL) 200 MG tablet Take by mouth. 07/24/18   [provider]  ibuprofen (ADVIL,MOTRIN) 600 MG tablet  08/03/16   [provider]  losartan-hydrochlorothiazide Konrad Penta) 100-25 MG tablet  08/03/16   [provider]  meloxicam (MOBIC) 15 MG tablet Take 1 tablet (15 mg total) by mouth daily. 09/27/16   Hyatt, Max T, DPM  mometasone (NASONEX) 50 MCG/ACT nasal spray  08/03/16   [provider]  ondansetron (ZOFRAN) 4 MG tablet  07/13/16   [provider]  pantoprazole (PROTONIX) 40 MG tablet  07/12/16   [provider]  polyethylene glycol powder (GLYCOLAX/MIRALAX) powder  06/25/16   [provider]  SSD 1 % cream  06/25/16   [provider]  sulfamethoxazole-trimethoprim (BACTRIM DS) 800-160 MG tablet Take 1 tablet by mouth 2 (two) times daily. for 7 days 12/14/18   [provider]  SYNJARDY XR  03-999 MG TB24 Take 2 tablets by mouth daily. 02/14/19   [provider]  Vitamin D, Ergocalciferol, (DRISDOL) 1.25 MG (50000 UT) CAPS capsule Take 50,000 Units by mouth once a week. 11/29/18   [provider]    Allergies Vancomycin and Ace inhibitors  Family History  Problem Relation Age of Onset  . Heart disease Mother 86  . Hypertension Mother   . Diabetes Mother     Social History Social History   Tobacco Use  . Smoking status: Never Smoker  . Smokeless tobacco: Never Used  Substance Use Topics  . Alcohol use: No  . Drug use: Never    Review of Systems  Constitutional: Positive for fatigue.  No fever/chills Eyes: No visual changes. ENT: No sore throat. Cardiovascular: Denies chest pain. Respiratory: Denies shortness of breath. Gastrointestinal: No abdominal pain.  No nausea, no vomiting.  Positive for diarrhea.  No constipation. Genitourinary: Negative for dysuria. Musculoskeletal: Negative for back pain. Skin: Negative for rash. Neurological: Negative for headaches, focal weakness or numbness.   ____________________________________________   PHYSICAL EXAM:  VITAL SIGNS: ED Triage Vitals  Enc Vitals Group     BP 02/22/20 2024 (!) 198/80     Pulse Rate 02/22/20 2024 88     Resp 02/22/20 2024 20     Temp 02/22/20 2024 98.4 F (36.9 C)     Temp Source 02/22/20 2024 Oral     SpO2 02/22/20 2024 98 %     Weight 02/22/20 2025 195 lb (88.5 kg)     Height 02/22/20 2025 5\' 4"  (1.626 m)     Head Circumference --      Peak Flow --      Pain Score 02/22/20 2025 0     Pain Loc --      Pain Edu? --      Excl. in GC? --     Constitutional: Alert and oriented. Well appearing and in no acute distress. Eyes: Conjunctivae are normal. PERRL. EOMI. Head: Atraumatic.  Draining tiny scalp abscess without surrounding erythema or fluctuance. Nose: No congestion/rhinnorhea. Mouth/Throat: Mucous membranes are moist.  Oropharynx non-erythematous. Neck: No  stridor.   Cardiovascular: Normal rate, regular rhythm. Grossly normal heart sounds.  Good peripheral circulation. Respiratory: Normal respiratory effort.  No retractions. Lungs CTAB. Gastrointestinal: Soft and nontender to light or deep palpation. No distention. No abdominal bruits. No CVA tenderness. Musculoskeletal: No lower extremity tenderness nor edema.  No joint effusions. Neurologic:  Normal speech and language. No gross focal neurologic deficits are appreciated. No gait instability. Skin:  Skin is warm, dry and intact. No rash noted. Psychiatric: Mood and affect are normal. Speech and behavior are normal.  ____________________________________________   LABS (all labs ordered are listed, but only abnormal results are displayed)  Labs Reviewed  GLUCOSE, CAPILLARY - Abnormal; Notable for the following components:      Result Value   Glucose-Capillary 285 (*)    All other components within normal limits  COMPREHENSIVE METABOLIC PANEL - Abnormal; Notable for the following components:   Glucose, Bld 308 (*)    AST 14 (*)    All other components within normal limits  CBC - Abnormal; Notable for the following components:   RBC  5.33 (*)    Hemoglobin 15.1 (*)    All other components within normal limits  URINALYSIS, COMPLETE (UACMP) WITH MICROSCOPIC - Abnormal; Notable for the following components:   Color, Urine YELLOW (*)    APPearance CLEAR (*)    Specific Gravity, Urine 1.033 (*)    Glucose, UA >=500 (*)    Bacteria, UA RARE (*)    All other components within normal limits  LIPASE, BLOOD   ____________________________________________  EKG  ED ECG REPORT I, Daishaun Ayre J, the attending physician, personally viewed and interpreted this ECG.   Date: 02/23/2020  EKG Time: 2029  Rate: 82  Rhythm: normal EKG, normal sinus rhythm  Axis: LAD  Intervals:none  ST&T Change: Nonspecific  ____________________________________________  RADIOLOGY  ED MD interpretation:  None  Official radiology report(s): No results found.  ____________________________________________   PROCEDURES  Procedure(s) performed (including Critical Care):  Procedures   ____________________________________________   INITIAL IMPRESSION / ASSESSMENT AND PLAN / ED COURSE  As part of my medical decision making, I reviewed the following data within the electronic MEDICAL RECORD NUMBER Nursing notes reviewed and incorporated, Labs reviewed, Old chart reviewed and Notes from prior ED visits     Che Below was evaluated in Emergency Department on 02/23/2020 for the symptoms described in the history of present illness. She was evaluated in the context of the global COVID-19 pandemic, which necessitated consideration that the patient might be at risk for infection with the SARS-CoV-2 virus that causes COVID-19. Institutional protocols and algorithms that pertain to the evaluation of patients at risk for COVID-19 are in a state of rapid change based on information released by regulatory bodies including the CDC and federal and state organizations. These policies and algorithms were followed during the patient's care in the ED.    59 year old diabetic presenting with fatigue and diarrhea. Differential diagnosis includes, but is not limited to, ovarian cyst, ovarian torsion, acute appendicitis, diverticulitis, urinary tract infection/pyelonephritis, endometriosis, bowel obstruction, colitis, renal colic, gastroenteritis, hernia, fibroids, endometriosis, etc.  Laboratory results unremarkable other than hyperglycemia and yeast.  Abdominal exam is benign.  Will infuse IV fluids and reassess.   Clinical Course as of Feb 22 554  Sat Feb 23, 2020  0335 Diflucan 150 mg given for budding yeast seen on UA.  Patient resting in no acute distress.  Will discharge home after completion of IV fluids.  Strict return precautions given.  Patient verbalizes understanding and agrees with plan of care.    [JS]    Clinical Course User Index [JS] Irean Hong, MD     ____________________________________________   FINAL CLINICAL IMPRESSION(S) / ED DIAGNOSES  Final diagnoses:  Diarrhea, unspecified type  Hyperglycemia     ED Discharge Orders    None       Note:  This document was prepared using Dragon voice recognition software and may include unintentional dictation errors.   Irean Hong, MD 02/23/20 [phone]

## 2020-05-11 ENCOUNTER — Emergency Department
Admission: EM | Admit: 2020-05-11 | Discharge: 2020-05-11 | Disposition: A | Discharge status: Home / Self Care | Payer: BC Managed Care – PPO | Attending: Emergency Medicine | Admitting: Emergency Medicine

## 2020-05-11 ENCOUNTER — Encounter: Payer: Self-pay | Admitting: Emergency Medicine

## 2020-05-11 ENCOUNTER — Emergency Department: Payer: BC Managed Care – PPO

## 2020-05-11 ENCOUNTER — Other Ambulatory Visit: Payer: Self-pay

## 2020-05-11 DIAGNOSIS — I110 Hypertensive heart disease with heart failure: Secondary | ICD-10-CM | POA: Diagnosis not present

## 2020-05-11 DIAGNOSIS — Z79899 Other long term (current) drug therapy: Secondary | ICD-10-CM | POA: Diagnosis not present

## 2020-05-11 DIAGNOSIS — E119 Type 2 diabetes mellitus without complications: Secondary | ICD-10-CM | POA: Insufficient documentation

## 2020-05-11 DIAGNOSIS — Z7984 Long term (current) use of oral hypoglycemic drugs: Secondary | ICD-10-CM | POA: Insufficient documentation

## 2020-05-11 DIAGNOSIS — I509 Heart failure, unspecified: Secondary | ICD-10-CM | POA: Diagnosis not present

## 2020-05-11 DIAGNOSIS — R079 Chest pain, unspecified: Secondary | ICD-10-CM | POA: Insufficient documentation

## 2020-05-11 LAB — BASIC METABOLIC PANEL
Anion gap: 7 (ref 5–15)
BUN: 16 mg/dL (ref 6–20)
CO2: 28 mmol/L (ref 22–32)
Calcium: 9.3 mg/dL (ref 8.9–10.3)
Chloride: 104 mmol/L (ref 98–111)
Creatinine, Ser: 0.72 mg/dL (ref 0.44–1.00)
GFR calc Af Amer: >60 mL/min (ref >60)
GFR calc non Af Amer: >60 mL/min (ref >60)
Glucose, Bld: 182 mg/dL — ABNORMAL HIGH (ref 70–99)
Potassium: 3.5 mmol/L (ref 3.5–5.1)
Sodium: 139 mmol/L (ref 135–145)

## 2020-05-11 LAB — HEPATIC FUNCTION PANEL
ALT: 16 U/L (ref 0–44)
AST: 16 U/L (ref 15–41)
Albumin: 3.9 g/dL (ref 3.5–5.0)
Alkaline Phosphatase: 67 U/L (ref 38–126)
Bilirubin, Direct: <0.1 mg/dL (ref 0.0–0.2)
Total Bilirubin: 0.5 mg/dL (ref 0.3–1.2)
Total Protein: 7.3 g/dL (ref 6.5–8.1)

## 2020-05-11 LAB — CBC
HCT: 42.6 % (ref 36.0–46.0)
Hemoglobin: 14.5 g/dL (ref 12.0–15.0)
MCH: 28 pg (ref 26.0–34.0)
MCHC: 34 g/dL (ref 30.0–36.0)
MCV: 82.2 fL (ref 80.0–100.0)
Platelets: 152 10*3/uL (ref 150–400)
RBC: 5.18 MIL/uL — ABNORMAL HIGH (ref 3.87–5.11)
RDW: 12.4 % (ref 11.5–15.5)
WBC: 6.2 10*3/uL (ref 4.0–10.5)
nRBC: 0 % (ref 0.0–0.2)

## 2020-05-11 LAB — LIPASE, BLOOD: Lipase: 54 U/L — ABNORMAL HIGH (ref 11–51)

## 2020-05-11 LAB — TROPONIN I (HIGH SENSITIVITY)
Troponin I (High Sensitivity): 4 ng/L (ref <18)
Troponin I (High Sensitivity): 3 ng/L (ref <18)

## 2020-05-11 MED ORDER — ALUM & MAG HYDROXIDE-SIMETH 200-200-20 MG/5ML PO SUSP
30.0000 mL | Freq: Once | ORAL | Status: AC
  Administered 2020-05-11: 30 mL via ORAL
  Filled 2020-05-11: qty 30

## 2020-05-11 MED ORDER — LIDOCAINE VISCOUS HCL 2 % MT SOLN
15.0000 mL | Freq: Once | OROMUCOSAL | Status: AC
  Administered 2020-05-11: 15 mL via ORAL
  Filled 2020-05-11: qty 15

## 2020-05-11 NOTE — ED Triage Notes (Signed)
Patient with complaint of sharp central pain radiating to bilateral arms that started yesterday. Patient states that pain become worse throughout the night. Patient states that she has been feeling dizzy and has vomited. Patient states that it is hard to swallow.

## 2020-05-11 NOTE — ED Notes (Signed)
Pt given crackers and water for PO challenge per EDP

## 2020-05-11 NOTE — ED Notes (Addendum)
Pt verbalized understanding of discharge instructions. NAD at this time. Signature pad not working. Hardcopy printed and signed by patient and this RN.

## 2020-05-11 NOTE — ED Provider Notes (Signed)
[facility] Emergency Department Provider Note  ____________________________________________   First MD Initiated Contact with Patient 05/11/20 [phone]     (approximate)  I have reviewed the triage vital signs and the nursing notes.   HISTORY  Chief Complaint Chest Pain    HPI Audrey Reynolds is a 59 y.o. female with diabetes, hypertension, anemia who comes in with chest pain.  Patient reports having chest pain that started yesterday.  Patient states the pain is on the right side of her chest, sharp stabbing sensation.  Been going on for the past few days.  She says it comes and goes, nothing in particular brings it on but states that it seems to be worse when she swallows, took some Maalox this morning around 430 and her symptoms seem to have resolved.  Denies ever having this previously but she was worried about a heart attack.  Patient also reports some discomfort with swallowing.  States that she has had 1 episode of vomiting yesterday and now just feels as if there is something stuck in her throat.  Denies remembering event or if he did get stuck.  Denies ever having this happen before.  Patient has been tolerating p.o. since then just feels a little irritated.  She states that this pain seems to go down into her chest.  She denies any shortness of breath, leg swelling, recent surgeries, history of PE, recent long travel.  Currently she rates her pain very minimal in her throat and not in the chest.  She denies any fevers.  She denies any shortness of breath.  She has not taken her hypertension medicine yet this morning          Past Medical History:  Diagnosis Date  . Anemia   . Arthritis   . Diabetes mellitus without complication ([facility])   . GERD (gastroesophageal reflux disease)   . Hypertension     Patient Active Problem List   Diagnosis Date Noted  . Atypical chest pain 06/23/2018  . Pulmonary hypertension, unspecified ([facility]) 06/23/2018  .  Essential hypertension 06/23/2018  . Long-term use of Plaquenil 04/13/2018  . No diabetic retinopathy in both eyes 01/12/2018  . Carpal tunnel syndrome of right wrist 11/22/2017  . Chronic right-sided low back pain with right-sided sciatica 11/22/2017  . Fatigue 11/22/2017  . Hair loss 11/22/2017  . Insomnia 11/22/2017  . Long term current use of non-steroidal anti-inflammatories (NSAID) 11/22/2017  . Positive ANA (antinuclear antibody) 11/22/2017  . Primary osteoarthritis involving multiple joints 11/22/2017  . Vitamin D deficiency 11/22/2017  . Benign paroxysmal positional vertigo due to bilateral vestibular disorder 05/05/2017  . Obesity, Class II, BMI 35-39.9 01/18/2017  . Seasonal allergic rhinitis due to pollen 08/03/2016  . Plantar fasciitis 12/30/2013  . Sleep disturbances 08/03/2013  . CHF (congestive heart failure) ([facility]) 05/16/2013  . Gastroesophageal reflux disease without esophagitis 05/16/2013  . Mixed hyperlipidemia 05/16/2013  . Type 2 diabetes mellitus without complication, with long-term current use of insulin ([facility]) 05/16/2013    Past Surgical History:  Procedure Laterality Date  . ABDOMINAL HYSTERECTOMY      Prior to Admission medications   Medication Sig Start Date End Date Taking? Authorizing Provider  amLODipine (NORVASC) 10 MG tablet  08/24/16   [provider]  atorvastatin (LIPITOR) 10 MG tablet  08/24/16   [provider]  clobetasol (TEMOVATE) 0.05 % external solution Apply topically 2 (two) times daily. 10/06/18   [provider]  Dulaglutide (TRULICITY) 0.75 MG/0.5ML SOPN Inject  0.75 mg into the skin once a week.    [provider]  Empagliflozin-metFORMIN HCl ER 03-999 MG TB24 Take by mouth. 02/13/19   [provider]  glimepiride (AMARYL) 4 MG tablet Take 4 mg by mouth 2 (two) times daily. 03/19/19   [provider]  glipiZIDE (GLUCOTROL) 10 MG tablet  08/03/16   [provider]    hydroxychloroquine (PLAQUENIL) 200 MG tablet Take by mouth. 07/24/18   [provider]  ibuprofen (ADVIL,MOTRIN) 600 MG tablet  08/03/16   [provider]  losartan-hydrochlorothiazide Mauri Reading) 100-25 MG tablet  08/03/16   [provider]  meloxicam (MOBIC) 15 MG tablet Take 1 tablet (15 mg total) by mouth daily. 09/27/16   Hyatt, Max T, DPM  mometasone (NASONEX) 50 MCG/ACT nasal spray  08/03/16   [provider]  ondansetron (ZOFRAN) 4 MG tablet  07/13/16   [provider]  pantoprazole (PROTONIX) 40 MG tablet  07/12/16   [provider]  polyethylene glycol powder (GLYCOLAX/MIRALAX) powder  06/25/16   [provider]  SSD 1 % cream  06/25/16   [provider]  sulfamethoxazole-trimethoprim (BACTRIM DS) 800-160 MG tablet Take 1 tablet by mouth 2 (two) times daily. for 7 days 12/14/18   [provider]  SYNJARDY XR 03-999 MG TB24 Take 2 tablets by mouth daily. 02/14/19   [provider]  Vitamin D, Ergocalciferol, (DRISDOL) 1.25 MG (50000 UT) CAPS capsule Take 50,000 Units by mouth once a week. 11/29/18   [provider]    Allergies Vancomycin and Ace inhibitors  Family History  Problem Relation Age of Onset  . Heart disease Mother 66  . Hypertension Mother   . Diabetes Mother     Social History Social History   Tobacco Use  . Smoking status: Never Smoker  . Smokeless tobacco: Never Used  Substance Use Topics  . Alcohol use: No  . Drug use: Never      Review of Systems Constitutional: No fever/chills Eyes: No visual changes. ENT: Positive sore throat Cardiovascular: Positive chest pain Respiratory: Denies shortness of breath. Gastrointestinal: No abdominal pain.  No nausea, no vomiting.  No diarrhea.  No constipation. Genitourinary: Negative for dysuria. Musculoskeletal: Negative for back pain. Skin: Negative for rash. Neurological: Negative for headaches, focal weakness or  numbness. All other ROS negative ____________________________________________   PHYSICAL EXAM:  VITAL SIGNS: ED Triage Vitals  Enc Vitals Group     BP 05/11/20 0410 (!) 194/95     Pulse Rate 05/11/20 0410 96     Resp 05/11/20 0410 20     Temp 05/11/20 0410 98.2 F (36.8 C)     Temp Source 05/11/20 0410 Oral     SpO2 05/11/20 0410 98 %     Weight 05/11/20 0410 198 lb (89.8 kg)     Height --      Head Circumference --      Peak Flow --      Pain Score 05/11/20 0418 8     Pain Loc --      Pain Edu? --      Excl. in GC? --     Constitutional: Alert and oriented. Well appearing and in no acute distress. Eyes: Conjunctivae are normal. EOMI. Head: Atraumatic. Nose: No congestion/rhinnorhea. Mouth/Throat: Mucous membranes are moist.  OP clear.  No erythema.  Tonsils normal.  Uvula midline Neck: No stridor. Trachea Midline. FROM Cardiovascular: Normal rate, regular rhythm. Grossly normal heart sounds.  Good peripheral circulation. Respiratory:  Normal respiratory effort.  No retractions. Lungs CTAB. Gastrointestinal: Soft and nontender. No distention. No abdominal bruits.  Musculoskeletal: No lower extremity tenderness nor edema.  No joint effusions. Neurologic:  Normal speech and language. No gross focal neurologic deficits are appreciated.  Skin:  Skin is warm, dry and intact. No rash noted. Psychiatric: Mood and affect are normal. Speech and behavior are normal. GU: Deferred   ____________________________________________   LABS (all labs ordered are listed, but only abnormal results are displayed)  Labs Reviewed  BASIC METABOLIC PANEL - Abnormal; Notable for the following components:      Result Value   Glucose, Bld 182 (*)    All other components within normal limits  CBC - Abnormal; Notable for the following components:   RBC 5.18 (*)    All other components within normal limits  LIPASE, BLOOD - Abnormal; Notable for the following components:   Lipase 54 (*)    All  other components within normal limits  HEPATIC FUNCTION PANEL  TROPONIN I (HIGH SENSITIVITY)  TROPONIN I (HIGH SENSITIVITY)   ____________________________________________   ED ECG REPORT I, Concha SeMary E Rohn Fritsch, the attending physician, personally viewed and interpreted this ECG.  Normal sinus rate of 80 no ST elevation, T wave inversions in lead III, aVF and V6 normal intervals ___Review of prior EKGs her T waves are more flattened in these leads may be upright.   _________________________________________  RADIOLOGY Vela ProseI, Jovahn Breit E Shevawn Langenberg, personally viewed and evaluated these images (plain radiographs) as part of my medical decision making, as well as reviewing the written report by the radiologist.  ED MD interpretation: No pneumonia  Official radiology report(s): DG Chest 2 View  Result Date: 05/11/2020 CLINICAL DATA:  59 year old female with history of chest pain radiating to the arms bilaterally. EXAM: CHEST - 2 VIEW COMPARISON:  Chest x-ray 11/19/2019. FINDINGS: Lung volumes are normal. No consolidative airspace disease. No pleural effusions. No pneumothorax. No pulmonary nodule or mass noted. Pulmonary vasculature and the cardiomediastinal silhouette are within normal limits. IMPRESSION: No radiographic evidence of acute cardiopulmonary disease. Electronically Signed   By: Trudie Reedaniel  Entrikin M.D.   On: 05/11/2020 04:35    ____________________________________________   PROCEDURES  Procedure(s) performed (including Critical Care):  Procedures   ____________________________________________   INITIAL IMPRESSION / ASSESSMENT AND PLAN / ED COURSE   Audrey Heaterlizabeth Phetteplace was evaluated in Emergency Department on 05/11/2020 for the symptoms described in the history of present illness. She was evaluated in the context of the global COVID-19 pandemic, which necessitated consideration that the patient might be at risk for infection with the SARS-CoV-2 virus that causes COVID-19. Institutional  protocols and algorithms that pertain to the evaluation of patients at risk for COVID-19 are in a state of rapid change based on information released by regulatory bodies including the CDC and federal and state organizations. These policies and algorithms were followed during the patient's care in the ED.    Most Likely DDx:  -MSK (atypical chest pain) but will get cardiac markers to evaluate for ACS given risk factors/age -OP exam is normal.  Suspect may be a little bit of esophageal irritation secondary to vomiting.  Denies a event where she remembered having a food impaction.  Seems to be acute in nature and I have lower suspicion for process such as cancer.  Patient is tolerating her secretions and looks very well.  Will give GI cocktail and p.o. challenge patient.   DDx that was also considered d/t potential to cause harm, but was  found less likely based on history and physical (as detailed above): -PNA (no fevers, cough but CXR to evaluate) -PNX (reassured with equal b/l breath sounds, CXR to evaluate) -Symptomatic anemia (will get H&H) -Pulmonary embolism as no sob at rest, not pleuritic in nature, no hypoxia -Aortic Dissection as no tearing pain and no radiation to the mid back, pulses equal -Pericarditis no rub on exam, EKG changes or hx to suggest dx -Tamponade (no notable SOB, tachycardic, hypotensive) -Esophageal rupture (no h/o diffuse vomitting/no crepitus)   Labs are reassuring.  No AKI No signs of anemia First cardiac marker is negative  Repeat cardiac marker negative  Patient feels better after the GI cocktail.  She is tolerating crackers and water at bedside.  Discussed with patient that she most likely irritated her esophagus.  If she continues to have discomfort she will need to follow-up with GI for endoscopy to make sure there is no signs of stricture, mass, other pathology.  This time there she is tolerating p.o. so does not require emergent endoscopy.  The chest pain  her cardiac markers are reassuring.  Possible to related to the esophageal irritation radiating to her chest.  However given her risk factors for heart disease I did give her a follow-up for cardiology.  Patient feels relieved and feels comfortable with being discharged home at this time given she is having no symptoms.  I discussed the provisional nature of ED diagnosis, the treatment so far, the ongoing plan of care, follow up appointments and return precautions with the patient and any family or support people present. They expressed understanding and agreed with the plan, discharged home.  ____________________________________   FINAL CLINICAL IMPRESSION(S) / ED DIAGNOSES   Final diagnoses:  Chest pain, unspecified type     MEDICATIONS GIVEN DURING THIS VISIT:  Medications  alum & mag hydroxide-simeth (MAALOX/MYLANTA) 200-200-20 MG/5ML suspension 30 mL (30 mLs Oral Given 05/11/20 0714)    And  lidocaine (XYLOCAINE) 2 % viscous mouth solution 15 mL (15 mLs Oral Given 05/11/20 4401)     ED Discharge Orders    None       Note:  This document was prepared using Dragon voice recognition software and may include unintentional dictation errors.   Concha Se, MD 05/11/20 [phone]

## 2020-05-11 NOTE — Discharge Instructions (Addendum)
There is no signs of a heart attack today.  You may have a little irritation of your esophagus.  This can be caused by many different things but there is a possibility it can resolve on its own.  If it is not resolving you can follow-up with the GI doctor for endoscopy to make sure there is nothing else that is causing it.  For your chest pain there is no signs of a heart attack.  You do have some risk factors for heart disease he can follow-up with a cardiologist not emergently.  Return to the ER if you develop worsening symptoms or any other concerns

## 2020-05-11 NOTE — ED Notes (Signed)
Patient reports having chest pain that radiated to both arms all night.  Also reports having throat pain.  Patient is resting quietly on stretcher, no acute distress noted.

## 2022-05-01 IMAGING — CR DG CHEST 2V
1 series · 2 of 2 positions shown · non-contrast
Comparison: Chest x-ray 11/19/2019.

CLINICAL DATA: 59-year-old female with history of chest pain
radiating to the arms bilaterally.

EXAM:
CHEST - 2 VIEW

[Series 1: dg chest 2 view · 0.14mm/px · 2 of 2 slices shown]
[im 1/2]
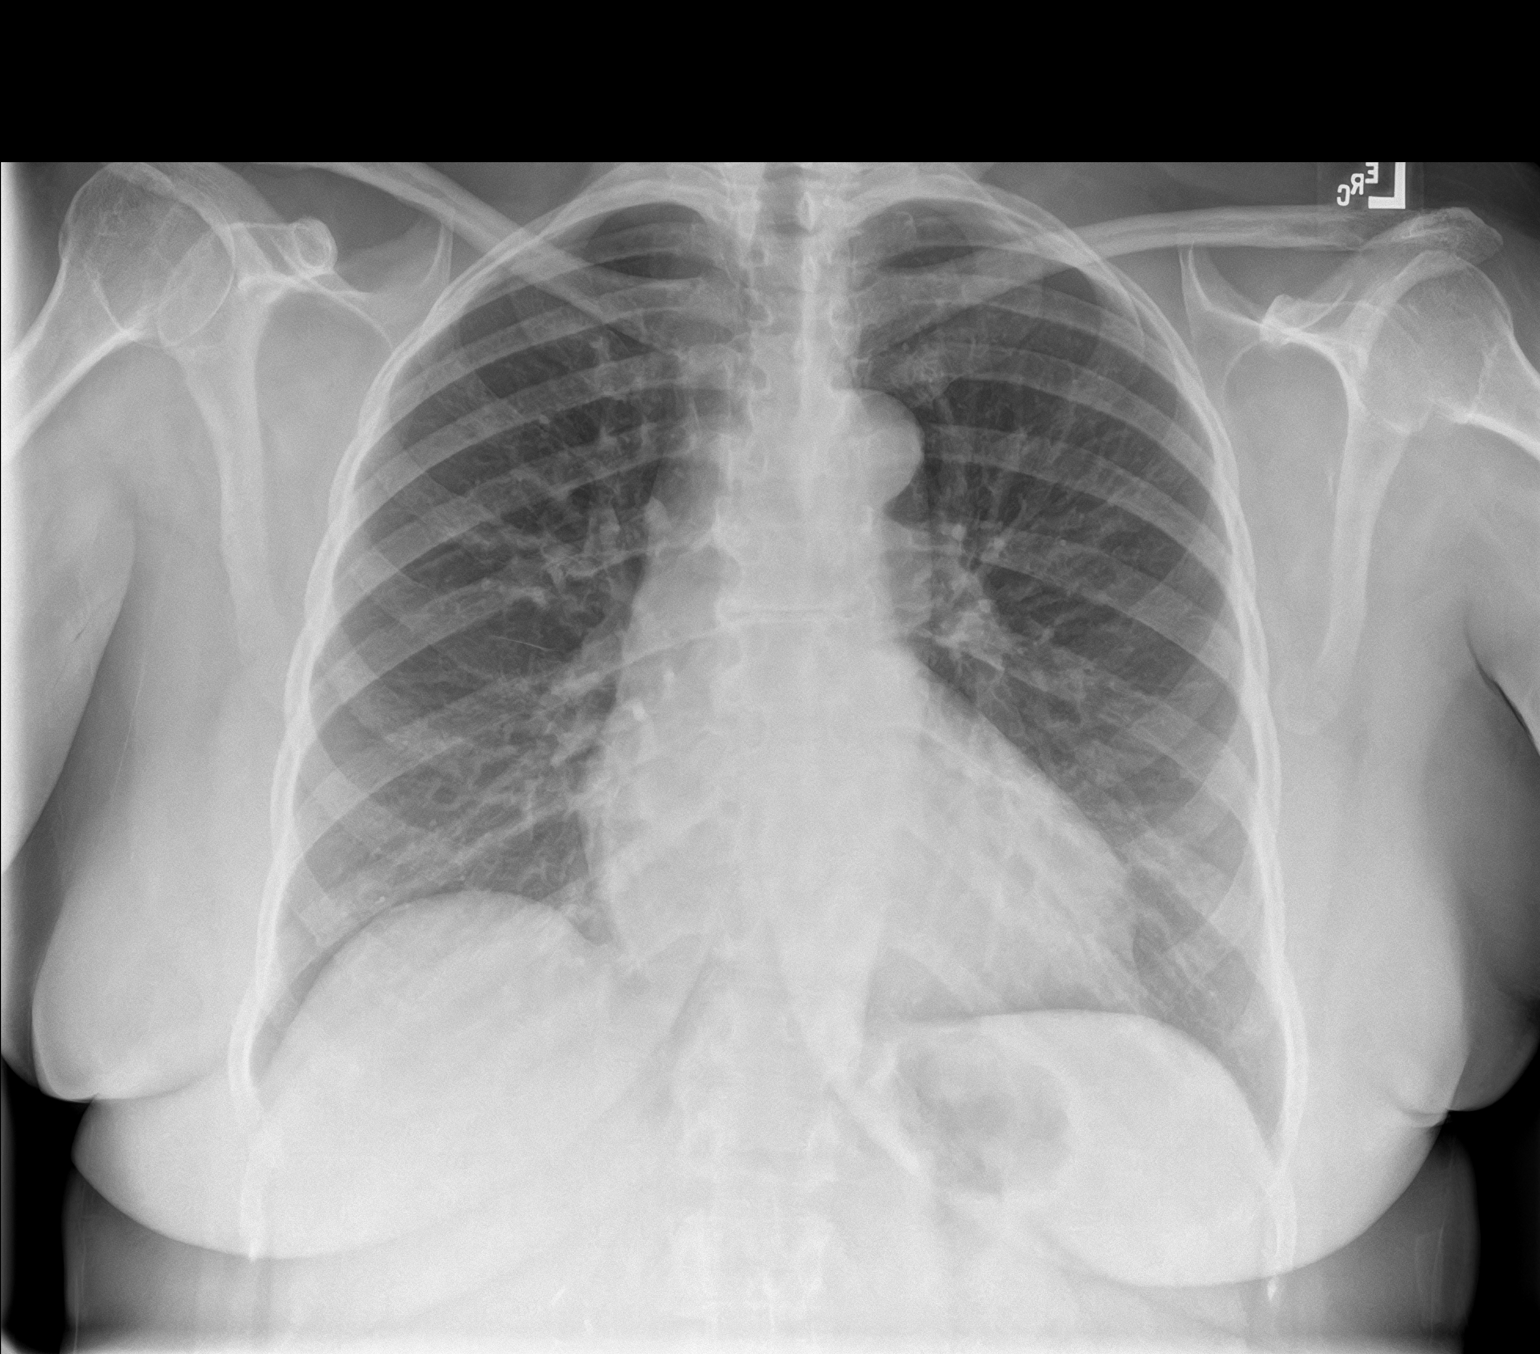
[im 2/2]
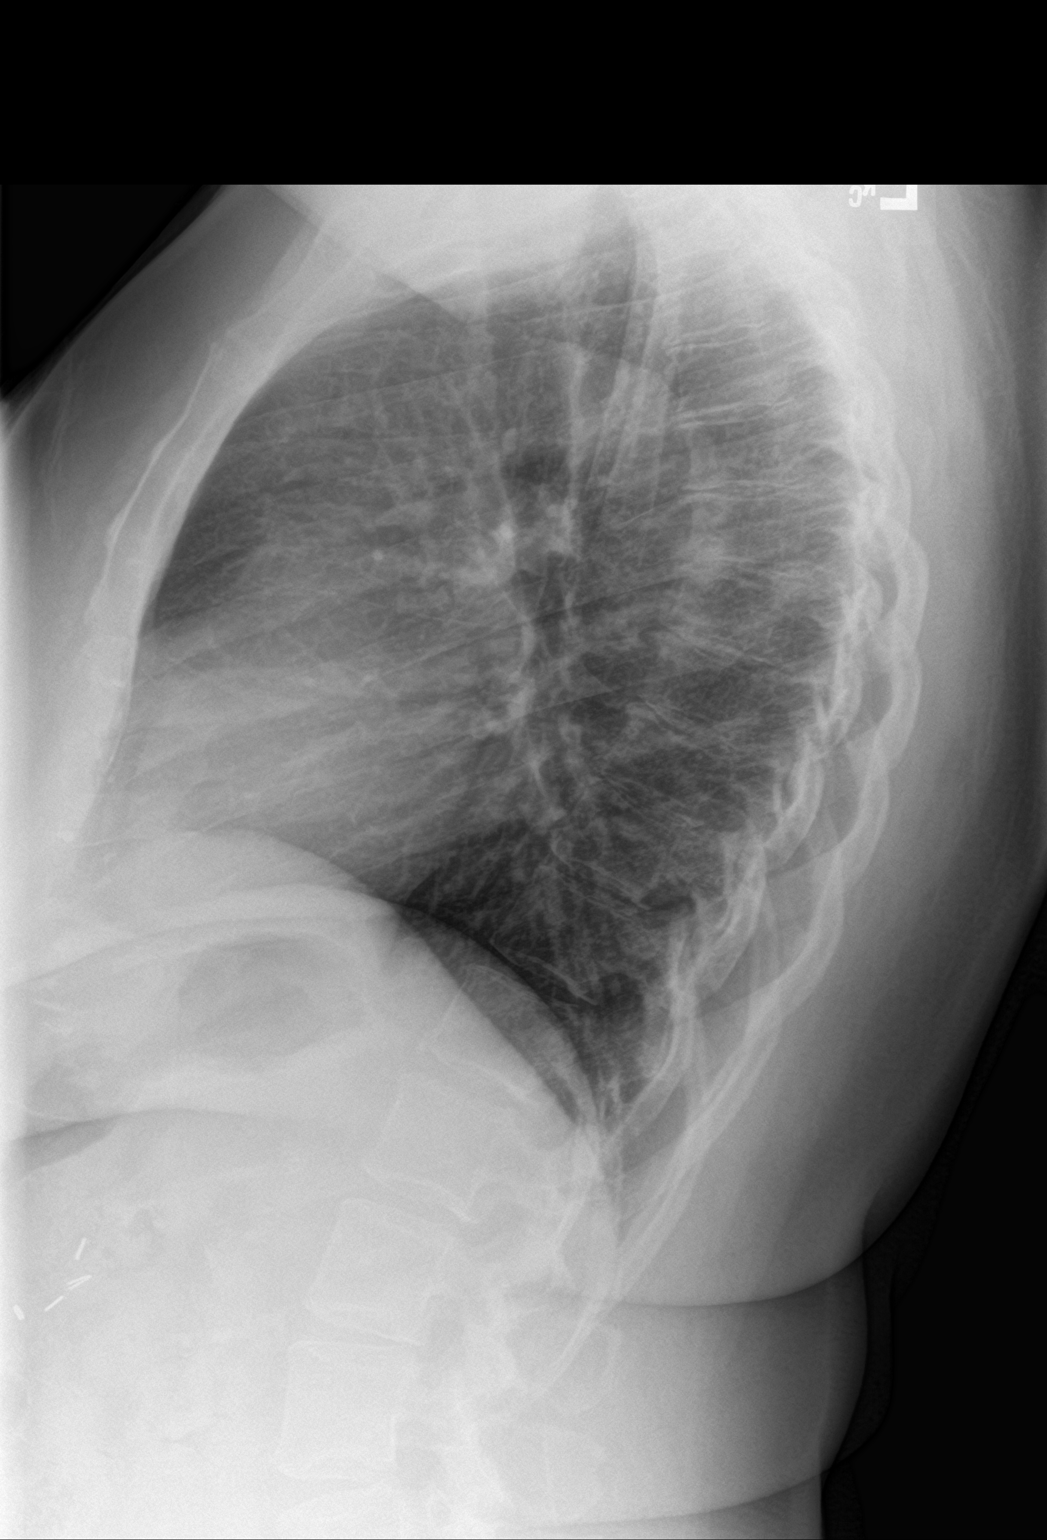

[2 of 2 positions shown; findings below may reference images not displayed]

FINDINGS: Lung volumes are normal. No consolidative airspace disease. No
pleural effusions. No pneumothorax. No pulmonary nodule or mass
noted. Pulmonary vasculature and the cardiomediastinal silhouette
are within normal limits.
IMPRESSION: No radiographic evidence of acute cardiopulmonary disease.

## 2023-07-09 ENCOUNTER — Encounter: Payer: Self-pay | Admitting: Emergency Medicine

## 2023-07-09 ENCOUNTER — Other Ambulatory Visit: Payer: Self-pay

## 2023-07-09 ENCOUNTER — Emergency Department
Admission: EM | Admit: 2023-07-09 | Discharge: 2023-07-09 | Disposition: A | Discharge status: Home / Self Care | Payer: BC Managed Care – PPO | Attending: Emergency Medicine | Admitting: Emergency Medicine

## 2023-07-09 DIAGNOSIS — M5416 Radiculopathy, lumbar region: Secondary | ICD-10-CM | POA: Insufficient documentation

## 2023-07-09 DIAGNOSIS — N390 Urinary tract infection, site not specified: Secondary | ICD-10-CM

## 2023-07-09 DIAGNOSIS — N3001 Acute cystitis with hematuria: Secondary | ICD-10-CM | POA: Insufficient documentation

## 2023-07-09 DIAGNOSIS — R109 Unspecified abdominal pain: Secondary | ICD-10-CM

## 2023-07-09 LAB — CBC
HCT: 39.6 % (ref 36.0–46.0)
Hemoglobin: 13.3 g/dL (ref 12.0–15.0)
MCH: 28.5 pg (ref 26.0–34.0)
MCHC: 33.6 g/dL (ref 30.0–36.0)
MCV: 84.8 fL (ref 80.0–100.0)
Platelets: 174 10*3/uL (ref 150–400)
RBC: 4.67 MIL/uL (ref 3.87–5.11)
RDW: 13.1 % (ref 11.5–15.5)
WBC: 6.4 10*3/uL (ref 4.0–10.5)
nRBC: 0 % (ref 0.0–0.2)

## 2023-07-09 LAB — BASIC METABOLIC PANEL
Anion gap: 10 (ref 5–15)
BUN: 17 mg/dL (ref 8–23)
CO2: 26 mmol/L (ref 22–32)
Calcium: 9.4 mg/dL (ref 8.9–10.3)
Chloride: 103 mmol/L (ref 98–111)
Creatinine, Ser: 0.89 mg/dL (ref 0.44–1.00)
GFR, Estimated: >60 mL/min (ref >60)
Glucose, Bld: 191 mg/dL — ABNORMAL HIGH (ref 70–99)
Potassium: 3.6 mmol/L (ref 3.5–5.1)
Sodium: 139 mmol/L (ref 135–145)

## 2023-07-09 LAB — URINALYSIS, ROUTINE W REFLEX MICROSCOPIC
Bilirubin Urine: NEGATIVE
Glucose, UA: >=500 mg/dL — AB
Ketones, ur: NEGATIVE mg/dL
Nitrite: POSITIVE — AB
Protein, ur: NEGATIVE mg/dL
Specific Gravity, Urine: 1.031 — ABNORMAL HIGH (ref 1.005–1.030)
pH: 5 (ref 5.0–8.0)

## 2023-07-09 MED ORDER — CYCLOBENZAPRINE HCL 5 MG PO TABS: 5.0000 mg | ORAL_TABLET | Freq: Three times a day (TID) | ORAL | 0 refills | Status: AC | PRN

## 2023-07-09 MED ORDER — FOSFOMYCIN TROMETHAMINE 3 G PO PACK
3.0000 g | PACK | Freq: Once | ORAL | Status: AC
  Administered 2023-07-09: 3 g via ORAL
  Filled 2023-07-09: qty 3

## 2023-07-09 NOTE — ED Provider Notes (Signed)
[facility] Provider Note   Event Date/Time   First MD Initiated Contact with Patient 07/09/23 0840     (approximate) History  Flank Pain (/)  HPI Audrey Reynolds is a 62 y.o. female who presents complaining of lower lumbar pain bilaterally that radiates around to the lower abdominal quadrants on both sides.  Patient endorses associated tingling to right lower extremity in the posterior thigh.  Patient denies any bowel/bladder incontinence or fever ROS: Patient currently denies any vision changes, tinnitus, difficulty speaking, facial droop, sore throat, chest pain, shortness of breath, abdominal pain, nausea/vomiting/diarrhea, dysuria, or weakness/numbness/paresthesias in any extremity   Physical Exam  Triage Vital Signs: ED Triage Vitals  Encounter Vitals Group     BP 07/09/23 0821 137/66     Systolic BP Percentile --      Diastolic BP Percentile --      Pulse Rate 07/09/23 0821 80     Resp 07/09/23 0821 17     Temp 07/09/23 0821 98.1 F (36.7 C)     Temp Source 07/09/23 0821 Oral     SpO2 07/09/23 0821 97 %     Weight 07/09/23 0816 198 lb (89.8 kg)     Height 07/09/23 0816 5\' 5"  (1.651 m)     Head Circumference --      Peak Flow --      Pain Score 07/09/23 0816 8     Pain Loc --      Pain Education --      Exclude from Growth Chart --    Most recent vital signs: Vitals:   07/09/23 0821 07/09/23 0900  BP: 137/66 132/67  Pulse: 80 77  Resp: 17   Temp: 98.1 F (36.7 C)   SpO2: 97%    General: Awake, oriented x4. CV:  Good peripheral perfusion.  Resp:  Normal effort.  Abd:  No distention.  Other:  Elderly overweight African-American female laying in bed in no acute distress resting comfortably in no acute distress ED Results / Procedures / Treatments  Labs (all labs ordered are listed, but only abnormal results are displayed) Labs Reviewed  URINALYSIS, ROUTINE W REFLEX MICROSCOPIC - Abnormal; Notable for the following components:       Result Value   Color, Urine YELLOW (*)    APPearance HAZY (*)    Specific Gravity, Urine 1.031 (*)    Glucose, UA >=500 (*)    Hgb urine dipstick SMALL (*)    Nitrite POSITIVE (*)    Leukocytes,Ua TRACE (*)    Bacteria, UA MANY (*)    All other components within normal limits  BASIC METABOLIC PANEL - Abnormal; Notable for the following components:   Glucose, Bld 191 (*)    All other components within normal limits  CBC  PROCEDURES: Critical Care performed: No Procedures MEDICATIONS ORDERED IN ED: Medications  fosfomycin (MONUROL) packet 3 g (3 g Oral Given 07/09/23 0918)   IMPRESSION / MDM / ASSESSMENT AND PLAN / ED COURSE  I reviewed the triage vital signs and the nursing notes.                             The patient is on the cardiac monitor to evaluate for evidence of arrhythmia and/or significant heart rate changes. Patient's presentation is most consistent with acute presentation with potential threat to life or bodily function. Patient presents for low back pain. Given History and Exam the patient appears  to be at low risk for Spinal Cord Compression Syndrome, Vertebral Malignancy/Mets, acute Spinal Fracture, Vertebral Osteomyelitis, Epidural Abscess, Infected or Obstructing Kidney Stone.  Their presentation appears most likely to be secondary to non-emergent musculoskeletal etiology vs non-emergent disc herniation.  ED Workup: Defer imaging and labwork for outpatient follow up at this time. Urinalysis incidentally shows nitrites with trace leukocytes and many bacteria.  Patient denies any urinary symptoms and therefore will treat empirically with fosfomycin given patient's back pain.  I do believe patient's back pain is musculoskeletal in nature however patient given strict return precautions regarding possible infectious sources including pyelonephritis.  Disposition: Discharge. Strict return precautions discussed with patient with full understanding. Advised patient to  follow up promptly with primary care provider   FINAL CLINICAL IMPRESSION(S) / ED DIAGNOSES   Final diagnoses:  Flank pain  Lumbar radiculopathy  Urinary tract infection with hematuria, site unspecified   Rx / DC Orders   ED Discharge Orders          Ordered    cyclobenzaprine (FLEXERIL) 5 MG tablet  3 times daily PRN        07/09/23 0908           Note:  This document was prepared using Dragon voice recognition software and may include unintentional dictation errors.   Merwyn Katos, MD 07/09/23 [phone]

## 2023-07-09 NOTE — ED Triage Notes (Signed)
Patient in via POV w/ complaints of bilateral flank pain with radiation into abdomen, ongoing x a couple of weeks but recently getting worse.  Reports some nausea, denies any vomiting/diarrhea.  Denies any urinary symptoms, other than foul smell to urine.    Ambulatory to triage, NAD noted at this time.
